# Patient Record
Sex: Male | Born: 1995 | Race: Black or African American | Hispanic: No | Marital: Single | Smoking: Never smoker
Health system: Southern US, Community
[De-identification: ages and names within clinical notes are randomized; demographics above are authoritative.]

---
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
## 2017-04-16 ENCOUNTER — Telehealth (INDEPENDENT_AMBULATORY_CARE_PROVIDER_SITE_OTHER): Payer: Self-pay | Admitting: Orthopedic Surgery

## 2017-04-16 NOTE — Telephone Encounter (Signed)
Patient called asked for a call back concerning his medical records. Patient would like a call back today if possible. Patient asked if the medical records release form can be emailed to him. Patient's email address is [email or web address]. Patient said he will email the form back. Patient said he does not have a fax number  The number to contact patient is [phone]
